# Patient Record
Sex: Female | Born: 1935 | Race: White | Hispanic: No | Smoking: Never smoker
Health system: Southern US, Community
[De-identification: ages and names within clinical notes are randomized; demographics above are authoritative.]

## PROBLEM LIST (undated history)

## (undated) DIAGNOSIS — I10 Essential (primary) hypertension: Secondary | ICD-10-CM

---

## 2015-02-26 ENCOUNTER — Encounter: Payer: Self-pay | Admitting: Emergency Medicine

## 2015-02-26 ENCOUNTER — Emergency Department: Payer: Medicare Other

## 2015-02-26 ENCOUNTER — Emergency Department
Admission: EM | Admit: 2015-02-26 | Discharge: 2015-02-26 | Disposition: A | Discharge status: Home / Self Care | Payer: Medicare Other | Attending: Emergency Medicine | Admitting: Emergency Medicine

## 2015-02-26 DIAGNOSIS — Z880 Allergy status to penicillin: Secondary | ICD-10-CM | POA: Insufficient documentation

## 2015-02-26 DIAGNOSIS — Z7902 Long term (current) use of antithrombotics/antiplatelets: Secondary | ICD-10-CM | POA: Insufficient documentation

## 2015-02-26 DIAGNOSIS — I10 Essential (primary) hypertension: Secondary | ICD-10-CM | POA: Diagnosis not present

## 2015-02-26 DIAGNOSIS — M79604 Pain in right leg: Secondary | ICD-10-CM | POA: Diagnosis not present

## 2015-02-26 DIAGNOSIS — Z79899 Other long term (current) drug therapy: Secondary | ICD-10-CM | POA: Diagnosis not present

## 2015-02-26 DIAGNOSIS — J45901 Unspecified asthma with (acute) exacerbation: Secondary | ICD-10-CM | POA: Insufficient documentation

## 2015-02-26 HISTORY — DX: Essential (primary) hypertension: I10

## 2015-02-26 LAB — COMPREHENSIVE METABOLIC PANEL
ALT: 14 U/L (ref 14–54)
ANION GAP: 9 (ref 5–15)
AST: 16 U/L (ref 15–41)
Albumin: 4.2 g/dL (ref 3.5–5.0)
Alkaline Phosphatase: 65 U/L (ref 38–126)
BUN: 20 mg/dL (ref 6–20)
CALCIUM: 9.4 mg/dL (ref 8.9–10.3)
CHLORIDE: 104 mmol/L (ref 101–111)
CO2: 25 mmol/L (ref 22–32)
Creatinine, Ser: 0.78 mg/dL (ref 0.44–1.00)
GFR calc non Af Amer: >60 mL/min (ref >60)
Glucose, Bld: 97 mg/dL (ref 65–99)
Potassium: 4.5 mmol/L (ref 3.5–5.1)
SODIUM: 138 mmol/L (ref 135–145)
Total Bilirubin: 0.6 mg/dL (ref 0.3–1.2)
Total Protein: 7.3 g/dL (ref 6.5–8.1)

## 2015-02-26 LAB — CBC WITH DIFFERENTIAL/PLATELET
Basophils Absolute: 0.1 10*3/uL (ref 0–0.1)
Basophils Relative: 1 %
EOS ABS: 0.5 10*3/uL (ref 0–0.7)
EOS PCT: 5 %
HCT: 39.1 % (ref 35.0–47.0)
Hemoglobin: 13.2 g/dL (ref 12.0–16.0)
LYMPHS ABS: 1.6 10*3/uL (ref 1.0–3.6)
Lymphocytes Relative: 18 %
MCH: 29.6 pg (ref 26.0–34.0)
MCHC: 33.7 g/dL (ref 32.0–36.0)
MCV: 87.9 fL (ref 80.0–100.0)
MONOS PCT: 9 %
Monocytes Absolute: 0.8 10*3/uL (ref 0.2–0.9)
Neutro Abs: 5.8 10*3/uL (ref 1.4–6.5)
Neutrophils Relative %: 67 %
PLATELETS: 265 10*3/uL (ref 150–440)
RBC: 4.45 MIL/uL (ref 3.80–5.20)
RDW: 14.5 % (ref 11.5–14.5)
WBC: 8.8 10*3/uL (ref 3.6–11.0)

## 2015-02-26 MED ORDER — TRAMADOL HCL 50 MG PO TABS: 50.0000 mg | ORAL_TABLET | Freq: Four times a day (QID) | ORAL | Status: AC | PRN

## 2015-02-26 NOTE — Discharge Instructions (Signed)
Keep leg elevated.   Take tylenol and motrin for pain.   Take ultram for severe pain.   Use compression stockings.   Avoid walking too much but please don't sit all the time.   See your doctor.  Return to ER if you have severe pain, chest pain, shortness of breath.

## 2015-02-26 NOTE — ED Provider Notes (Signed)
CSN: [id number]     Arrival date & time 02/26/15  1004 History   First MD Initiated Contact with Patient 02/26/15 1058     Chief Complaint  Patient presents with  . Leg Pain     (Consider location/radiation/quality/duration/timing/severity/associated sxs/prior Treatment) The history is provided by the patient.  Heather Gonzales is a 81 y.o. female hx of HTN here with R leg pain and cramps. Patient is from [place] and took a 5 hour flight here 2 days ago. She has been walking a lot yesterday with her family. States that her right calf and foot hurts since yesterday. Denies any falls or injuries to me. Denies any chest pain or short of breath. States that she has history of asthma has some chronic wheezing. Patient on plavix for previous stroke.    Past Medical History  Diagnosis Date  . Hypertension    History reviewed. No pertinent past surgical history. No family history on file. Social History  Substance Use Topics  . Smoking status: Never Smoker   . Smokeless tobacco: None  . Alcohol Use: No   OB History    Gravida Para Term Preterm AB TAB SAB Ectopic Multiple Living   Review of Systems  Musculoskeletal:       R calf pain   All other systems reviewed and are negative.     Allergies  Epinephrine; Ampicillin; Ciprofloxacin; Doxycycline; Erythromycin; and Vicodin  Home Medications   Prior to Admission medications   Medication Sig Start Date End Date Taking? Authorizing Provider  albuterol (PROVENTIL HFA;VENTOLIN HFA) 108 (90 BASE) MCG/ACT inhaler Inhale 2 puffs into the lungs every 6 (six) hours as needed for wheezing or shortness of breath.   Yes Historical Provider, MD  ALPRAZolam Prudy Feeler) 0.5 MG tablet Take 0.5 mg by mouth at bedtime as needed for sleep.   Yes Historical Provider, MD  clopidogrel (PLAVIX) 75 MG tablet Take 75 mg by mouth daily.   Yes Historical Provider, MD  diltiazem (CARDIZEM) 30 MG tablet Take 30 mg by mouth 2 (two) times daily.    Yes Historical Provider, MD  lisinopril (PRINIVIL,ZESTRIL) 10 MG tablet Take 10 mg by mouth 2 (two) times daily.   Yes Historical Provider, MD  omeprazole (PRILOSEC) 40 MG capsule Take 40 mg by mouth daily.   Yes Historical Provider, MD   BP 191/80 mmHg  Pulse 63  Temp(Src) 97.7 F (36.5 C) (Oral)  Resp 20  Ht  (1.549 m)  Wt 149 lb (67.586 kg)  BMI 28.17 kg/m2  SpO2 98% Physical Exam  Constitutional: She is oriented to person, place, and time. She appears well-developed and well-nourished.  HENT:  Head: Normocephalic.  Mouth/Throat: Oropharynx is clear and moist.  Eyes: Conjunctivae are normal. Pupils are equal, round, and reactive to light.  Neck: Normal range of motion. Neck supple.  Cardiovascular: Normal rate, regular rhythm and normal heart sounds.   Pulmonary/Chest:  Minimal wheezing, no retractions (chronic per patient)   Abdominal: Soft. Bowel sounds are normal. She exhibits no distension. There is no tenderness. There is no rebound.  Musculoskeletal:  R calf tenderness, minimal swelling   Neurological: She is alert and oriented to person, place, and time.  Skin: Skin is warm and dry.  Psychiatric: She has a normal mood and affect. Her behavior is normal. Judgment and thought content normal.  Nursing note and vitals reviewed.   ED Course  Procedures (including critical  care time) Labs Review Labs Reviewed  CBC WITH DIFFERENTIAL/PLATELET  COMPREHENSIVE METABOLIC PANEL    Imaging Review US Venous Img Lower Unilateral Right  02/26/2015   CLINICAL DATA:  Leg pain, diffuse pain since yesterday.  EXAM: RIGHT LOWER EXTREMITY VENOUS DOPPLER ULTRASOUND  TECHNIQUE: Gray-scale sonography with graded compression, as well as color Doppler and duplex ultrasound were performed to evaluate the lower extremity deep venous systems from the level of the common femoral vein and including the common femoral, femoral, profunda femoral, popliteal and calf veins including the  posterior tibial, peroneal and gastrocnemius veins when visible. The superficial great saphenous vein was also interrogated. Spectral Doppler was utilized to evaluate flow at rest and with distal augmentation maneuvers in the common femoral, femoral and popliteal veins.  COMPARISON:  None.  FINDINGS: Contralateral Common Femoral Vein: Respiratory phasicity is normal and symmetric with the symptomatic side. No evidence of thrombus. Normal compressibility.  Common Femoral Vein: No evidence of thrombus. Normal compressibility, respiratory phasicity and response to augmentation.  Saphenofemoral Junction: No evidence of thrombus. Normal compressibility and flow on color Doppler imaging.  Profunda Femoral Vein: No evidence of thrombus. Normal compressibility and flow on color Doppler imaging.  Femoral Vein: No evidence of thrombus. Normal compressibility, respiratory phasicity and response to augmentation.  Popliteal Vein: No evidence of thrombus. Normal compressibility, respiratory phasicity and response to augmentation.  Calf Veins: No evidence of thrombus. Normal compressibility and flow on color Doppler imaging.  Superficial Great Saphenous Vein: No evidence of thrombus. Normal compressibility and flow on color Doppler imaging.  Venous Reflux:  None.  Other Findings:  None.  IMPRESSION: No evidence of deep venous thrombosis.   Electronically Signed   By: Elige Ko   On: 02/26/2015 13:35   I have personally reviewed and evaluated these images and lab results as part of my medical decision-making.   EKG Interpretation None      MDM   Final diagnoses:  Leg pain, diffuse, right   Heather Gonzales is a 81 y.o. female here with R calf pain. Likely muscle strain vs DVT. Will check labs, DVT study.   1:48 PM DVT neg. Labs unremarkable. Didn't take BP meds today but no signs of hypertensive emergency. Will dc home with ultram prn. Recommend leg elevation, compression stockings      Richardean Canal,  MD 02/26/15 1349

## 2015-02-26 NOTE — ED Notes (Signed)
Pt to ed with c/o right leg pain, started yesterday.  Per family pt was on plane for 5 hours on Monday.  Pt then started having pain last night.  Pt states pain worse when putting heel flat to ground while walking.  Pt states she may have injured leg while trying to get into car last night.

## 2015-02-26 NOTE — ED Notes (Signed)
MD at bedside. Dr. Yao. 

## 2015-02-26 NOTE — ED Notes (Signed)
Air flight approx 5 hour flight on Monday , last night right posterior leg pain , worse with applying weight to leg , while at rest aching pain 4/10 , distal pulse present , skin color wnl

## 2015-02-26 NOTE — ED Notes (Signed)
Patient transported to Ultrasound 

## 2015-07-23 DEATH — deceased

## 2015-10-31 IMAGING — US US EXTREM LOW VENOUS*R*
1 series · 13 of 24 positions shown · non-contrast
Comparison: None.

CLINICAL DATA: Leg pain, diffuse pain since yesterday.



[Series 1: us extrem low venous*right* · 0.08mm/px · 13 of 31 slices shown]
[im 1/31]
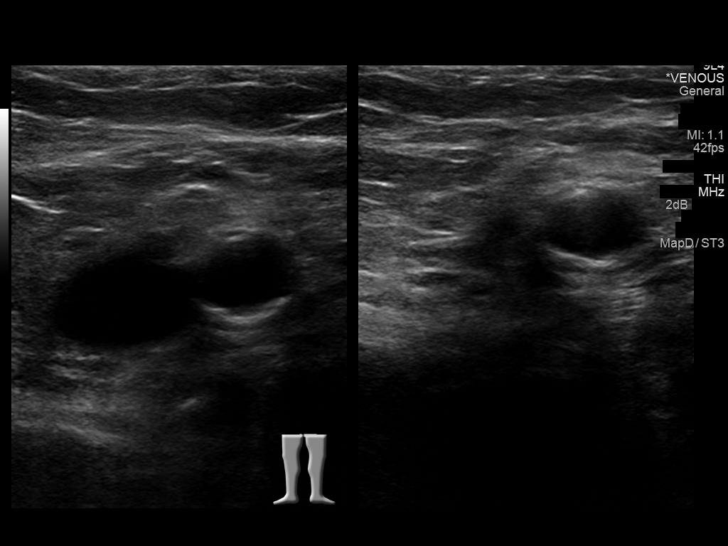
[im 3/31]
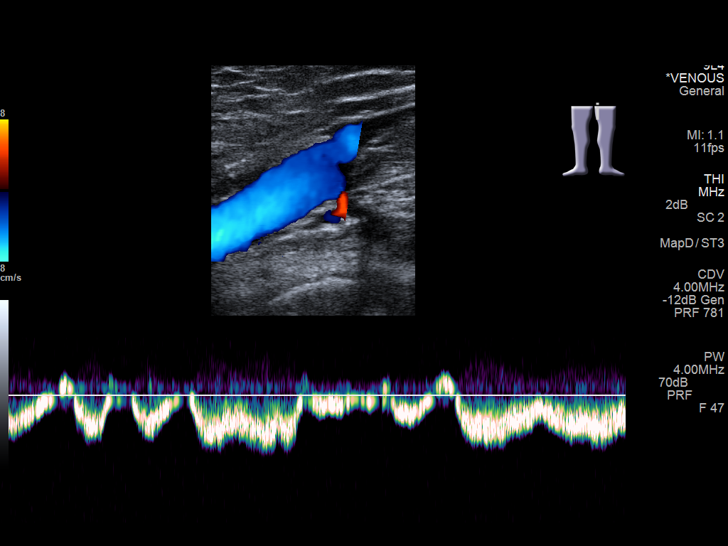
[im 6/31]
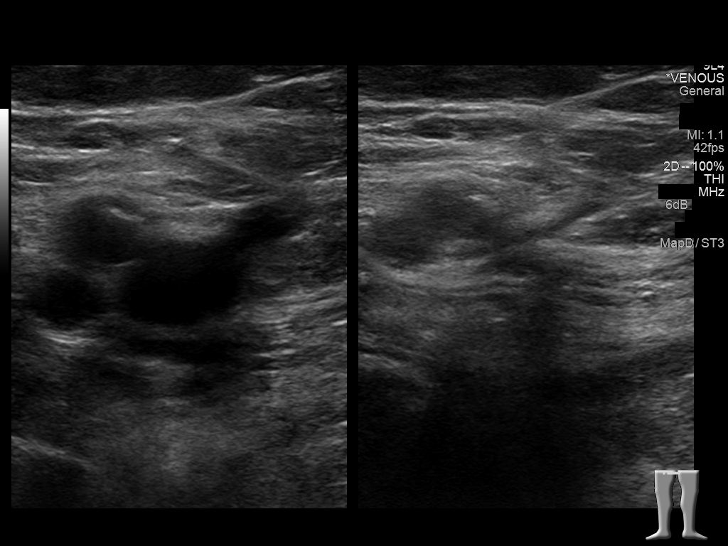
[im 8/31]
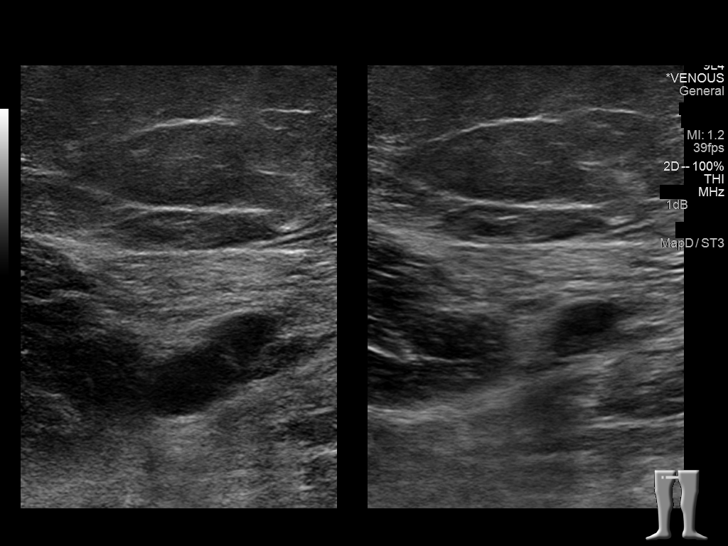
[im 11/31]
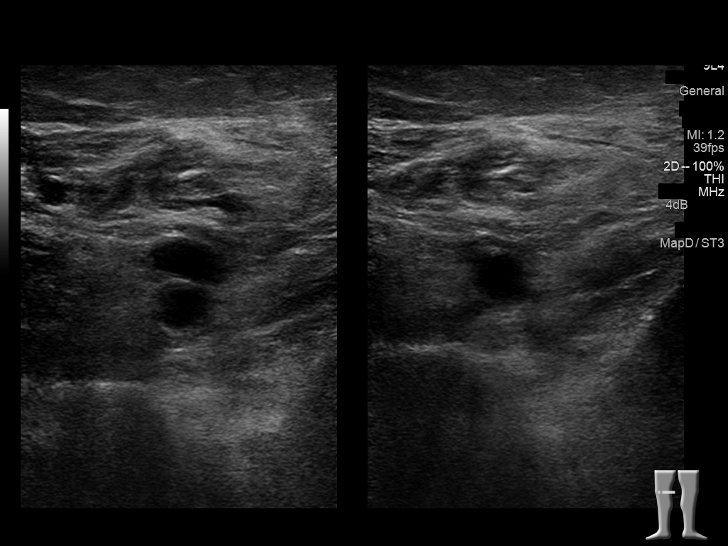
[im 14/31]
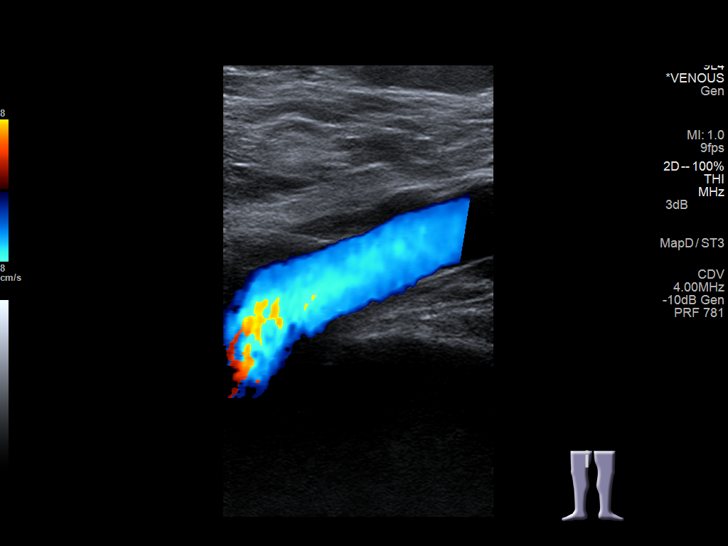
[im 16/31]
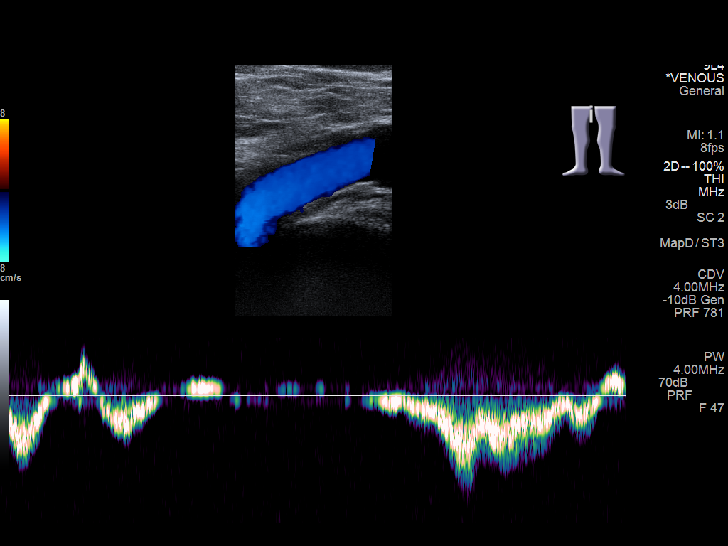
[im 17/31]
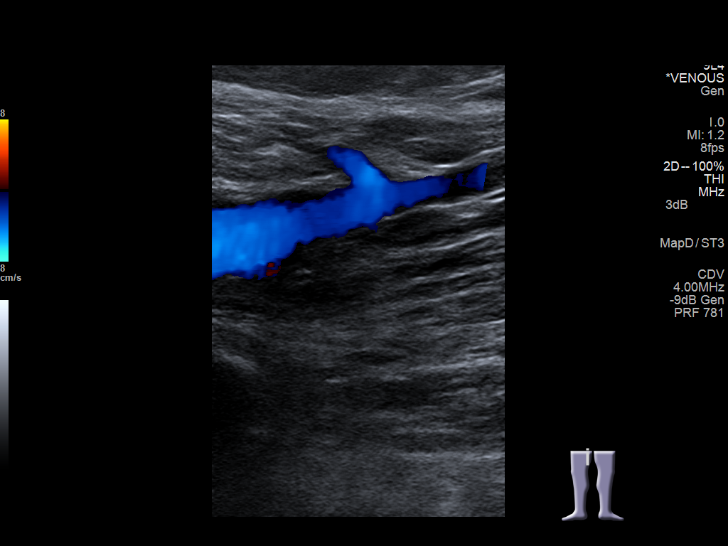
[im 20/31]
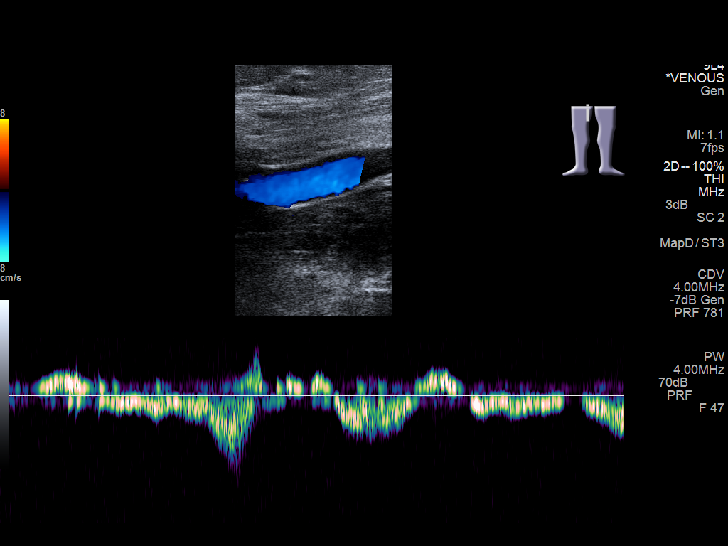
[im 23/31]
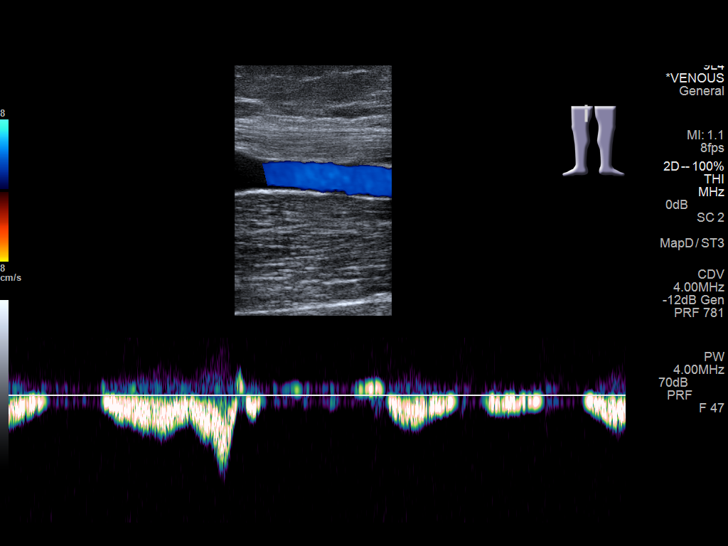
[im 25/31]
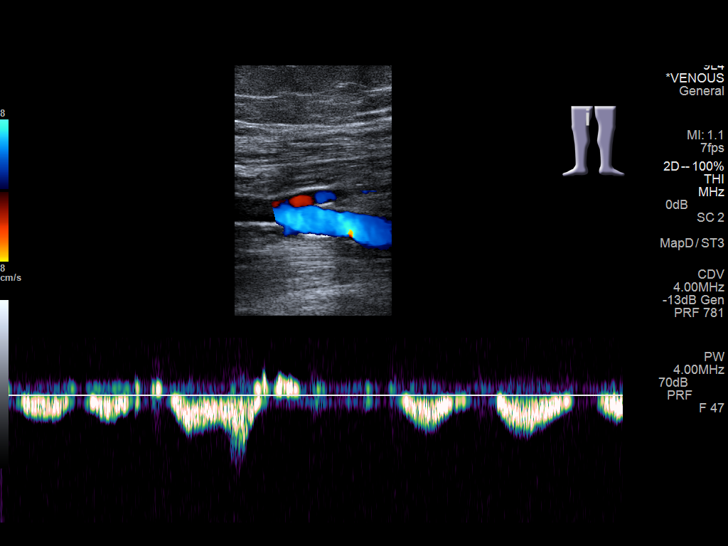
[im 28/31]
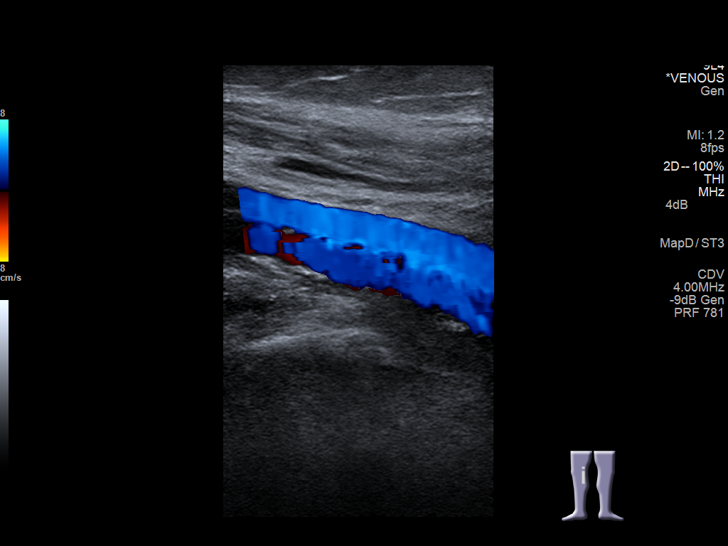
[im 31/31]
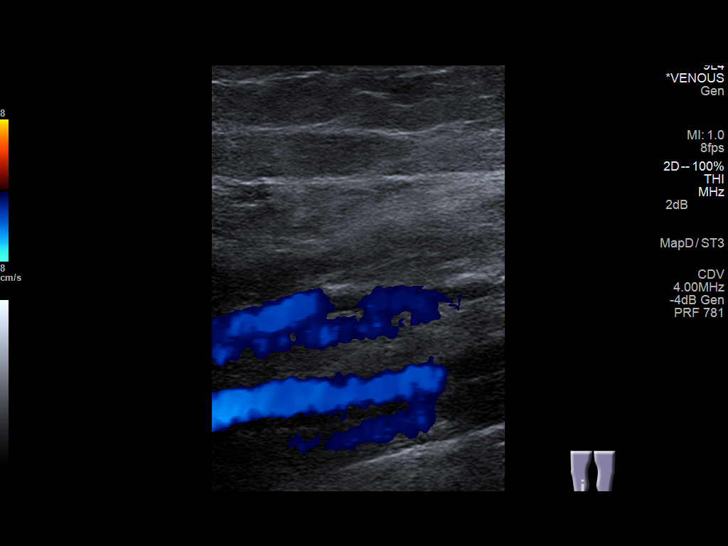

[13 of 24 positions shown; findings below may reference images not displayed]

FINDINGS: Contralateral Common Femoral Vein: Respiratory phasicity is normal
and symmetric with the symptomatic side. No evidence of thrombus.
Normal compressibility.

Common Femoral Vein: No evidence of thrombus. Normal
compressibility, respiratory phasicity and response to augmentation.

Saphenofemoral Junction: No evidence of thrombus. Normal
compressibility and flow on color Doppler imaging.

Profunda Femoral Vein: No evidence of thrombus. Normal
compressibility and flow on color Doppler imaging.

Femoral Vein: No evidence of thrombus. Normal compressibility,
respiratory phasicity and response to augmentation.

Popliteal Vein: No evidence of thrombus. Normal compressibility,
respiratory phasicity and response to augmentation.

Calf Veins: No evidence of thrombus. Normal compressibility and flow
on color Doppler imaging.

Superficial Great Saphenous Vein: No evidence of thrombus. Normal
compressibility and flow on color Doppler imaging.

Venous Reflux:  None.

Other Findings:  None.
IMPRESSION: No evidence of deep venous thrombosis.
